# Patient Record
Sex: Male | Born: 1985 | Race: White | Hispanic: No | Marital: Married | Smoking: Current every day smoker
Health system: Southern US, Community
[De-identification: ages and names within clinical notes are randomized; demographics above are authoritative.]

## PROBLEM LIST (undated history)

## (undated) ENCOUNTER — Emergency Department (HOSPITAL_COMMUNITY): Admission: EM | Payer: Self-pay | Source: Home / Self Care

---
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
## 2012-09-28 ENCOUNTER — Encounter (HOSPITAL_COMMUNITY): Payer: Self-pay

## 2012-09-28 ENCOUNTER — Emergency Department (HOSPITAL_COMMUNITY): Payer: Self-pay

## 2012-09-28 ENCOUNTER — Emergency Department (HOSPITAL_COMMUNITY)
Admission: EM | Admit: 2012-09-28 | Discharge: 2012-09-28 | Disposition: A | Discharge status: Home / Self Care | Payer: Self-pay | Attending: Emergency Medicine | Admitting: Emergency Medicine

## 2012-09-28 DIAGNOSIS — S63501A Unspecified sprain of right wrist, initial encounter: Secondary | ICD-10-CM

## 2012-09-28 DIAGNOSIS — S63509A Unspecified sprain of unspecified wrist, initial encounter: Secondary | ICD-10-CM | POA: Insufficient documentation

## 2012-09-28 DIAGNOSIS — IMO0002 Reserved for concepts with insufficient information to code with codable children: Secondary | ICD-10-CM | POA: Insufficient documentation

## 2012-09-28 DIAGNOSIS — Y9389 Activity, other specified: Secondary | ICD-10-CM | POA: Insufficient documentation

## 2012-09-28 DIAGNOSIS — S60511A Abrasion of right hand, initial encounter: Secondary | ICD-10-CM

## 2012-09-28 DIAGNOSIS — F17200 Nicotine dependence, unspecified, uncomplicated: Secondary | ICD-10-CM | POA: Insufficient documentation

## 2012-09-28 DIAGNOSIS — S62319A Displaced fracture of base of unspecified metacarpal bone, initial encounter for closed fracture: Secondary | ICD-10-CM | POA: Insufficient documentation

## 2012-09-28 DIAGNOSIS — Y929 Unspecified place or not applicable: Secondary | ICD-10-CM | POA: Insufficient documentation

## 2012-09-28 DIAGNOSIS — S62306A Unspecified fracture of fifth metacarpal bone, right hand, initial encounter for closed fracture: Secondary | ICD-10-CM

## 2012-09-28 MED ORDER — OXYCODONE-ACETAMINOPHEN 5-325 MG PO TABS: 1.0000 | ORAL_TABLET | ORAL | Status: DC | PRN

## 2012-09-28 MED ORDER — OXYCODONE-ACETAMINOPHEN 5-325 MG PO TABS
1.0000 | ORAL_TABLET | Freq: Once | ORAL | Status: AC
  Administered 2012-09-28: 1 via ORAL
  Filled 2012-09-28: qty 1

## 2012-09-28 MED ORDER — CEPHALEXIN 500 MG PO CAPS: 500.0000 mg | ORAL_CAPSULE | Freq: Four times a day (QID) | ORAL | Status: DC

## 2012-09-28 NOTE — ED Provider Notes (Signed)
History     CSN: [id number]  Arrival date & time 09/28/12  1501   First MD Initiated Contact with Patient 09/28/12 1603      Chief Complaint  Patient presents with  . Hand Pain    (Consider location/radiation/quality/duration/timing/severity/associated sxs/prior treatment) HPI Comments: Patient c/o pain and swelling to his right lateral hand that began 2 days ago after he punched a wooden pole.  Pain is worse with movement and improves somewhat with elevation and ice.  He also noticed a small abrasion to the dorsal hand.  He has been cleaning his hand with soap and water.  He denies redness, drainage or numbness and weakness of his hand or fingers.  He also denies foreign body or proximal tenderness. States his  Td is UTD   Patient is a 27 y.o. male presenting with hand pain. The history is provided by the patient.  Hand Pain This is a new problem. Episode onset: 2 days ago. The problem occurs constantly. The problem has been unchanged. Associated symptoms include arthralgias and joint swelling. Pertinent negatives include no fever, nausea, neck pain, numbness, swollen glands, vomiting or weakness. The symptoms are aggravated by bending (movement and palpation). He has tried ice and position changes for the symptoms. The treatment provided no relief.    History reviewed. No pertinent past medical history.  History reviewed. No pertinent past surgical history.  No family history on file.  History  Substance Use Topics  . Smoking status: Current Every Day Smoker -- 0.50 packs/day  . Smokeless tobacco: Not on file  . Alcohol Use: No     Comment: occasional      Review of Systems  Constitutional: Negative for fever and appetite change.  HENT: Negative for neck pain.   Gastrointestinal: Negative for nausea and vomiting.  Musculoskeletal: Positive for joint swelling and arthralgias.  Skin:       Abrasion dorsal right hand  Neurological: Negative for weakness and numbness.  All  other systems reviewed and are negative.    Allergies  Review of patient's allergies indicates no known allergies.  Home Medications  No current outpatient prescriptions on file.  BP 164/90  Pulse 103  Temp(Src) 97.6 F (36.4 C) (Oral)  Resp 24  Ht 5\' 1"  (1.549 m)  Wt 210 lb (95.255 kg)  BMI 39.7 kg/m2  SpO2 98%  Physical Exam  Nursing note and vitals reviewed. Constitutional: He is oriented to person, place, and time. He appears well-developed and well-nourished. No distress.  HENT:  Head: Normocephalic and atraumatic.  Cardiovascular: Normal rate, regular rhythm and normal heart sounds.   No murmur heard. Pulmonary/Chest: Effort normal and breath sounds normal.  Musculoskeletal: He exhibits edema and tenderness.       Right hand: He exhibits decreased range of motion, tenderness, bony tenderness and swelling. He exhibits normal two-point discrimination, normal capillary refill and no deformity. Normal sensation noted. Normal strength noted. He exhibits no wrist extension trouble.       Hands: Moderate STS and slight bruising to the dorsal aspect of the lateral right hand.  ttp over the fifth MC.  Pea sized abrasion present w/o surrounding erythema, excessive warmth or drainage.  Appears scabbed over.  Radial pulse is brisk, distal sensation intact.  CR< 2 sec.  Patient also has ttp of the lateral wrist w/o edema or bony deformity of the wrist. FROM of the right elbow  Neurological: He is alert and oriented to person, place, and time. He exhibits normal muscle tone.  Coordination normal.  Skin: Skin is warm and dry.    ED Course  Procedures (including critical care time)  Labs Reviewed - No data to display Dg Hand Complete Right  09/28/2012  *RADIOLOGY REPORT*  Clinical Data: Punching injury.  Right lateral hand pain and swelling.  RIGHT HAND - COMPLETE 3+ VIEW  Comparison: None.  Findings: A comminuted fracture of the distal fifth metacarpal is seen with the mild to moderate  volar angulation of the distal fracture fragment.  No other fractures are identified.  No evidence of dislocation. The  IMPRESSION: Comminuted fracture of the distal fifth metacarpal, with mild to moderate volar angulation.   Original Report Authenticated By: Myles Rosenthal, M.D.       Ulnar gutter splint applied, re-evaluated by me.  pain improved, remains NV intact.  Sling also applied by nursing.    MDM   Small abrasion to dorsal right hand that appears to be healing well.  No clinical signs of infection or obvious FB's.  Abrasion was covered with Webril prior to splint application.     4:42 PM Consulted Dr. Romeo Apple.  Will see pt in his office, advised to have pt call tomorrow for appt time.    Prescribed: Keflex Percocet #20  The patient appears reasonably screened and/or stabilized for discharge and I doubt any other medical condition or other [facility] requiring further screening, evaluation, or treatment in the ED at this time prior to discharge.       Kamerin Axford L. Trisha Mangle, PA-C 09/28/12 1649

## 2012-09-28 NOTE — ED Notes (Signed)
Pt c/o right hand pain and swelling since punching a wooden pole Friday am.

## 2012-09-29 ENCOUNTER — Encounter: Payer: Self-pay | Admitting: Orthopedic Surgery

## 2012-09-29 ENCOUNTER — Ambulatory Visit (INDEPENDENT_AMBULATORY_CARE_PROVIDER_SITE_OTHER): Payer: Self-pay | Admitting: Orthopedic Surgery

## 2012-09-29 VITALS — BP 130/68 | Ht 71.0 in | Wt 217.0 lb

## 2012-09-29 DIAGNOSIS — S62309A Unspecified fracture of unspecified metacarpal bone, initial encounter for closed fracture: Secondary | ICD-10-CM | POA: Insufficient documentation

## 2012-09-29 MED ORDER — OXYCODONE-ACETAMINOPHEN 5-325 MG PO TABS: 1.0000 | ORAL_TABLET | ORAL | Status: DC | PRN

## 2012-09-29 NOTE — Patient Instructions (Addendum)
Keep  Cast dry   Do not get wet   If it gets wet dry with a hair dryer on low setting and call the office   OOW today fracture hand

## 2012-09-29 NOTE — Progress Notes (Signed)
  Subjective:    Aaron Li is a 27 y.o. male who presents for evaluation of right hand/finger pain. Onset was sudden, related to hit railing. Mechanism of injury: punching . The pain is severe, worsens with movement, and is relieved by rest. There is no associated numbness, tingling, weakness in hand . Evaluation to date: plain films: Comminuted fracture right hand fifth metacarpal at the neck shaft area. Treatment to date: Splint, Percocet recommendation ice elevation.  The following portions of the patient's history were reviewed and updated as appropriate: allergies, current medications, past family history, past medical history, past social history, past surgical history and problem list.  Review of Systems A comprehensive review of systems was negative except for: Fever, chills, fatigue cough, snoring, heartburn, nausea, vomiting, constipation, diarrhea.    Objective:    BP 130/68  Ht 5\' 11"  (1.803 m)  Wt 217 lb (98.431 kg)  BMI 30.28 kg/m2  General appearance is normal, the patient is alert and oriented x3 with normal mood and affect. Ambulation is normal Right hand:  soft tissue tenderness and swelling at the Fifth metacarpal of the right hand and Tenderness and swelling of the wrist as well. Range of motion is diminished at the metacarpophalangeal joints, joints are stable. Muscle tone is normal skin abrasion is over the fourth metacarpal no surrounding erythema  Left hand:  normal exam, no swelling, tenderness, instability; ligaments intact, full ROM both hands, wrists, and finger joints and Motor exam is normal   Imaging: X-ray right hand: soft tissue swelling and fracture of Fifth metacarpal distal with angulation    Assessment:    Fracture of Fifth metacarpal right hand    Plan:    Recommend splint and followup in 2 weeks to start active range of motion. This patient is a self-pay cannot afford surgery which would running in the neighborhood of about $8000.We  decided to go ahead and treat him with splinting and early range of motion excepting a possible deformity of the knuckle but normal hand function.

## 2012-09-29 NOTE — ED Provider Notes (Signed)
Medical screening examination/treatment/procedure(s) were performed by non-physician practitioner and as supervising physician I was immediately available for consultation/collaboration.    Vida Roller, MD 09/29/12 [phone]

## 2012-10-07 ENCOUNTER — Ambulatory Visit (INDEPENDENT_AMBULATORY_CARE_PROVIDER_SITE_OTHER): Payer: Self-pay | Admitting: Orthopedic Surgery

## 2012-10-07 ENCOUNTER — Encounter: Payer: Self-pay | Admitting: Orthopedic Surgery

## 2012-10-07 VITALS — BP 118/72 | Ht 71.0 in | Wt 217.0 lb

## 2012-10-07 DIAGNOSIS — S62309D Unspecified fracture of unspecified metacarpal bone, subsequent encounter for fracture with routine healing: Secondary | ICD-10-CM

## 2012-10-07 DIAGNOSIS — S5290XD Unspecified fracture of unspecified forearm, subsequent encounter for closed fracture with routine healing: Secondary | ICD-10-CM

## 2012-10-07 NOTE — Progress Notes (Signed)
Patient ID: Aaron Li, male   DOB: 24-Sep-1985, 27 y.o.   MRN: [id number] Chief Complaint  Patient presents with  . Follow-up    follow up right hand fracture DOI 09/26/12   Aaron Li is a 27 y.o. male who presents for evaluation of right hand/finger pain. Onset was sudden, related to hit railing. Mechanism of injury: punching . The pain is severe, worsens with movement, and is relieved by rest. There is no associated numbness, tingling, weakness in hand . Evaluation to date: plain films: Comminuted fracture right hand fifth metacarpal at the neck shaft area. Treatment to date: Splint, Percocet recommendation ice elevation.   The patient reports continued pain in his right hand he took his plaster splint are placed in an aluminum splint with buddy taping as the additional immobilization  Recommend return in one week to remove the splint and start buddy taping range of motion exercises  He is agreeable to avoiding x-ray as an interval study an x-ray and it at the end of treatment when we think it's healed to keep his cost down The patient is a self-pay cannot afford any surgical intervention or fancy splints Continue current pain medication

## 2012-10-14 ENCOUNTER — Ambulatory Visit (INDEPENDENT_AMBULATORY_CARE_PROVIDER_SITE_OTHER): Payer: Self-pay | Admitting: Orthopedic Surgery

## 2012-10-14 ENCOUNTER — Encounter: Payer: Self-pay | Admitting: Orthopedic Surgery

## 2012-10-14 VITALS — BP 120/82 | Ht 71.0 in | Wt 217.0 lb

## 2012-10-14 DIAGNOSIS — S62309A Unspecified fracture of unspecified metacarpal bone, initial encounter for closed fracture: Secondary | ICD-10-CM

## 2012-10-14 MED ORDER — OXYCODONE-ACETAMINOPHEN 5-325 MG PO TABS: 1.0000 | ORAL_TABLET | ORAL | Status: DC | PRN

## 2012-10-14 NOTE — Patient Instructions (Addendum)
Tape fingers together start moving the joint as instructed   Ok to change the tape as needed

## 2012-10-14 NOTE — Progress Notes (Signed)
Patient ID: Aaron Li, male   DOB: December 20, 1985, 27 y.o.   MRN: [id number] Chief Complaint  Patient presents with  . Follow-up    Follow- up right hand fracture DOI 09/26/12   BP 120/82  Ht 5\' 11"  (1.803 m)  Wt 217 lb (98.431 kg)  BMI 30.28 kg/m2  Alignment of the finger is normal   Apply tape   Refill medication start ROM exercises

## 2012-11-06 ENCOUNTER — Ambulatory Visit: Payer: Self-pay | Admitting: Orthopedic Surgery

## 2016-08-14 ENCOUNTER — Encounter (INDEPENDENT_AMBULATORY_CARE_PROVIDER_SITE_OTHER): Payer: Self-pay | Admitting: Internal Medicine

## 2016-08-14 ENCOUNTER — Encounter (INDEPENDENT_AMBULATORY_CARE_PROVIDER_SITE_OTHER): Payer: Self-pay

## 2016-08-27 ENCOUNTER — Encounter (INDEPENDENT_AMBULATORY_CARE_PROVIDER_SITE_OTHER): Payer: Self-pay | Admitting: Internal Medicine

## 2016-08-27 ENCOUNTER — Encounter (INDEPENDENT_AMBULATORY_CARE_PROVIDER_SITE_OTHER): Payer: Self-pay | Admitting: *Deleted

## 2016-08-27 ENCOUNTER — Ambulatory Visit (INDEPENDENT_AMBULATORY_CARE_PROVIDER_SITE_OTHER): Payer: Self-pay | Admitting: Internal Medicine

## 2016-08-27 VITALS — BP 130/85 | HR 100 | Temp 98.1°F | Ht 71.0 in | Wt 238.5 lb

## 2016-08-27 DIAGNOSIS — R6881 Early satiety: Secondary | ICD-10-CM

## 2016-08-27 DIAGNOSIS — R61 Generalized hyperhidrosis: Secondary | ICD-10-CM

## 2016-08-27 DIAGNOSIS — R634 Abnormal weight loss: Secondary | ICD-10-CM

## 2016-08-27 LAB — COMPREHENSIVE METABOLIC PANEL
ALT: 24 U/L (ref 9–46)
AST: 15 U/L (ref 10–40)
Albumin: 4 g/dL (ref 3.6–5.1)
Alkaline Phosphatase: 69 U/L (ref 40–115)
BUN: 13 mg/dL (ref 7–25)
CHLORIDE: 103 mmol/L (ref 98–110)
CO2: 28 mmol/L (ref 20–31)
CREATININE: 0.86 mg/dL (ref 0.60–1.35)
Calcium: 9.8 mg/dL (ref 8.6–10.3)
GLUCOSE: 100 mg/dL — AB (ref 65–99)
Potassium: 5.1 mmol/L (ref 3.5–5.3)
Sodium: 138 mmol/L (ref 135–146)
Total Bilirubin: 0.6 mg/dL (ref 0.2–1.2)
Total Protein: 7.3 g/dL (ref 6.1–8.1)

## 2016-08-27 LAB — CBC WITH DIFFERENTIAL/PLATELET
BASOS ABS: 0 {cells}/uL (ref 0–200)
Basophils Relative: 0 %
EOS ABS: 222 {cells}/uL (ref 15–500)
Eosinophils Relative: 2 %
HEMATOCRIT: 36.3 % — AB (ref 38.5–50.0)
Hemoglobin: 12 g/dL — ABNORMAL LOW (ref 13.2–17.1)
LYMPHS PCT: 16 %
Lymphs Abs: 1776 cells/uL (ref 850–3900)
MCH: 30.2 pg (ref 27.0–33.0)
MCHC: 33.1 g/dL (ref 32.0–36.0)
MCV: 91.4 fL (ref 80.0–100.0)
MONO ABS: 888 {cells}/uL (ref 200–950)
MPV: 9.6 fL (ref 7.5–12.5)
Monocytes Relative: 8 %
NEUTROS PCT: 74 %
Neutro Abs: 8214 cells/uL — ABNORMAL HIGH (ref 1500–7800)
Platelets: 396 10*3/uL (ref 140–400)
RBC: 3.97 MIL/uL — ABNORMAL LOW (ref 4.20–5.80)
RDW: 13.1 % (ref 11.0–15.0)
WBC: 11.1 10*3/uL — ABNORMAL HIGH (ref 3.8–10.8)

## 2016-08-27 LAB — SEDIMENTATION RATE: Sed Rate: 14 mm/hr (ref 0–15)

## 2016-08-27 NOTE — Progress Notes (Addendum)
   Subjective:    Patient ID: Aaron SchaumannSebastian C Li, male    DOB: Sep 01, 1985, 31 y.o.   MRN: [id number]  HPI Referred by Dr.Guarino for weight loss, loss of appetite, cold sweats, fatigue. Cough.  Saw Dr Wende CreaseGuarino 08/10/2016. No fever associated with his symptoms. He tells me he has night sweats. He is not sleeping well.  Has a dry cough. He says he has lost about 15 pounds since the end of December.  He is not having any pain. Marland Kitchen. He has an appetite. He has early satiety.  He has nausea when he has night sweats. He did have some chest early in January and felt like a long needle going thru his chest into his back. Chest pain off and on. All of his symptoms started after returning from a family trip to [place].  No acid reflux. He has a BM daily. No melena or BRRB. States he feels 50-60% better. 1/262018  H and H 12.8 and 37.9, platelet ct 549 08/10/2016 Chest xray: negative. No trauma.  Review of Systems No past medical history on file.  No past surgical history on file.  No Known Allergies  No current outpatient prescriptions on file prior to visit.   No current facility-administered medications on file prior to visit.    No current outpatient prescriptions on file.   No current facility-administered medications for this visit.        Objective:   Physical Exam Blood pressure 130/85, pulse 100, temperature 98.1 F (36.7 C), height 5\' 11"  (1.803 m), weight 238 lb 8 oz (108.2 kg).  Alert and oriented. Skin warm and dry. Oral mucosa is moist.   . Sclera anicteric, conjunctivae is pink. Thyroid not enlarged. No cervical lymphadenopathy. Lungs clear. Heart regular rate and rhythm.  Abdomen is soft. Bowel sounds are positive. No hepatomegaly. No abdominal masses felt. No tenderness.  No edema to lower extremities.  .       Assessment & Plan:  Early satiety, weight loss, night sweats.  Am going to get a CT abdomen/pelvis with CM.  CBC, Hepatic function. sedrate

## 2016-08-27 NOTE — Patient Instructions (Signed)
Labs and CT abdomen/pelvis with CM.  

## 2016-08-31 ENCOUNTER — Encounter (INDEPENDENT_AMBULATORY_CARE_PROVIDER_SITE_OTHER): Payer: Self-pay

## 2016-09-03 ENCOUNTER — Ambulatory Visit (HOSPITAL_COMMUNITY)
Admission: RE | Admit: 2016-09-03 | Discharge: 2016-09-03 | Disposition: A | Discharge status: Home / Self Care | Payer: Self-pay | Source: Ambulatory Visit | Attending: Internal Medicine | Admitting: Internal Medicine

## 2016-09-03 DIAGNOSIS — K7689 Other specified diseases of liver: Secondary | ICD-10-CM | POA: Insufficient documentation

## 2016-09-03 DIAGNOSIS — R61 Generalized hyperhidrosis: Secondary | ICD-10-CM

## 2016-09-03 DIAGNOSIS — R6881 Early satiety: Secondary | ICD-10-CM

## 2016-09-03 DIAGNOSIS — R634 Abnormal weight loss: Secondary | ICD-10-CM

## 2016-09-03 DIAGNOSIS — I313 Pericardial effusion (noninflammatory): Secondary | ICD-10-CM | POA: Insufficient documentation

## 2016-09-03 MED ORDER — IOPAMIDOL (ISOVUE-300) INJECTION 61%
100.0000 mL | Freq: Once | INTRAVENOUS | Status: AC | PRN
  Administered 2016-09-03: 100 mL via INTRAVENOUS

## 2016-09-04 ENCOUNTER — Other Ambulatory Visit (INDEPENDENT_AMBULATORY_CARE_PROVIDER_SITE_OTHER): Payer: Self-pay | Admitting: Internal Medicine

## 2016-09-04 ENCOUNTER — Telehealth (INDEPENDENT_AMBULATORY_CARE_PROVIDER_SITE_OTHER): Payer: Self-pay | Admitting: Internal Medicine

## 2016-09-04 DIAGNOSIS — I3139 Other pericardial effusion (noninflammatory): Secondary | ICD-10-CM

## 2016-09-04 DIAGNOSIS — I313 Pericardial effusion (noninflammatory): Secondary | ICD-10-CM

## 2016-09-04 NOTE — Telephone Encounter (Signed)
error 

## 2016-09-04 NOTE — Telephone Encounter (Signed)
Cardiology referral  

## 2016-09-04 NOTE — Telephone Encounter (Signed)
Spoke to cardiology office, they will contact patient with appt, patient aware

## 2016-10-05 ENCOUNTER — Ambulatory Visit (INDEPENDENT_AMBULATORY_CARE_PROVIDER_SITE_OTHER): Payer: Self-pay | Admitting: Cardiovascular Disease

## 2016-10-05 ENCOUNTER — Encounter: Payer: Self-pay | Admitting: Cardiovascular Disease

## 2016-10-05 VITALS — BP 140/90 | HR 104 | Ht 71.0 in | Wt 243.0 lb

## 2016-10-05 DIAGNOSIS — I313 Pericardial effusion (noninflammatory): Secondary | ICD-10-CM

## 2016-10-05 DIAGNOSIS — I3139 Other pericardial effusion (noninflammatory): Secondary | ICD-10-CM

## 2016-10-05 DIAGNOSIS — Z716 Tobacco abuse counseling: Secondary | ICD-10-CM

## 2016-10-05 DIAGNOSIS — R079 Chest pain, unspecified: Secondary | ICD-10-CM

## 2016-10-05 NOTE — Progress Notes (Signed)
CARDIOLOGY CONSULT NOTE  Patient ID: Aaron Li MRN: [id number] DOB/AGE: 1985-11-23 31 y.o.  Admit date: (Not on file) Primary Physician: No PCP Per Patient Referring Physician: Dorene Ar NP  Reason for Consultation: chest pain, pericardial effusion  HPI: The patient is a 31 year old male who has been referred by gastroenterology for the evaluation of chest pain and a pericardial effusion. He saw them in February for evaluation of weight loss and diminished appetite. A CT of the abdomen and pelvis showed a small pericardial effusion.  He started developing symptoms in December 2017. There occur about once or twice a week. He describes a retrosternal chest pain which is sometimes dull and sometimes sharp, and aggravated with deep inspiration. He believes his GI symptoms were due to a virus have completely resolved. He denies abdominal pain, nausea, vomiting, diarrhea, and constipation. He did have some fevers in 07/2016 but these have resolved. He has had increasing exertional dyspnea for the past 2 months and decreased stamina.  He smokes one pack of cigarettes daily or less and has done so for 15 years.  ECG performed in the office today showed sinus tachycardia with a diffuse nonspecific T wave abnormality, 102 bpm.     No Known Allergies  No current outpatient prescriptions on file.   No current facility-administered medications for this visit.     History reviewed. No pertinent past medical history.  History reviewed. No pertinent surgical history.  Social History   Social History  . Marital status: Married    Spouse name: N/A  . Number of children: N/A  . Years of education: N/A   Occupational History  . Not on file.   Social History Main Topics  . Smoking status: Current Every Day Smoker    Packs/day: 1.00  . Smokeless tobacco: Never Used  . Alcohol use No     Comment: ery little.   . Drug use: No  . Sexual activity: Not on file   Other  Topics Concern  . Not on file   Social History Narrative  . No narrative on file     No family history of premature CAD in 1st degree relatives.  Prior to Admission medications   Not on File     Review of systems complete and found to be negative unless listed above in HPI     Physical exam Blood pressure 140/90, pulse (!) 104, height 5\' 11"  (1.803 m), weight 243 lb (110.2 kg), SpO2 97 %. General: NAD Neck: No JVD, no thyromegaly or thyroid nodule.  Lungs: Clear to auscultation bilaterally with normal respiratory effort. CV: Nondisplaced PMI. Regular rate and rhythm, normal S1/S2, no S3/S4, no murmur.  No peripheral edema.  No carotid bruit.  Normal pedal pulses.  Abdomen: Soft, nontender, no hepatosplenomegaly, no distention.  Skin: Intact without lesions or rashes.  Neurologic: Alert and oriented x 3.  Psych: Normal affect. Extremities: No clubbing or cyanosis.  HEENT: Normal.   ECG: Most recent ECG reviewed.  Telemetry: Independently reviewed.  Labs:   Lab Results  Component Value Date   WBC 11.1 (H) 08/27/2016   HGB 12.0 (L) 08/27/2016   HCT 36.3 (L) 08/27/2016   MCV 91.4 08/27/2016   PLT 396 08/27/2016   No results for input(s): NA, K, CL, CO2, BUN, CREATININE, CALCIUM, PROT, BILITOT, ALKPHOS, ALT, AST, GLUCOSE in the last 168 hours.  Invalid input(s): LABALBU No results found for: CKTOTAL, CKMB, CKMBINDEX, TROPONINI No results found for: CHOL No  results found for: HDL No results found for: LDLCALC No results found for: TRIG No results found for: CHOLHDL No results found for: LDLDIRECT       Studies: No results found.  ASSESSMENT AND PLAN:  1. Chest pain: Atypical for an ischemic etiology. He denies exertional chest pain and it appears to be more pleuritic in quality. I do not appreciate a pericardial rub. I will obtain an echocardiogram to evaluate cardiac structure and function.  2. Pericardial effusion: I will order a 2-D echocardiogram with  Doppler to evaluate cardiac structure, function, and regional wall motion.  3. Tobacco abuse: Cessation counseling provided (1 minute).  Dispo: fu to be determined.   Signed: Prentice DockerSuresh Fawnda Vitullo, M.D., F.A.C.C.  10/05/2016, 8:44 AM

## 2016-10-05 NOTE — Patient Instructions (Signed)
Your physician recommends that you schedule a follow-up appointment in: to be determined after echo    Your physician has requested that you have an echocardiogram. Echocardiography is a painless test that uses sound waves to create images of your heart. It provides your doctor with information about the size and shape of your heart and how well your heart's chambers and valves are working. This procedure takes approximately one hour. There are no restrictions for this procedure.         Thank you for choosing [facility] !         

## 2016-10-08 ENCOUNTER — Ambulatory Visit (HOSPITAL_COMMUNITY)
Admission: RE | Admit: 2016-10-08 | Discharge: 2016-10-08 | Disposition: A | Discharge status: Home / Self Care | Payer: Self-pay | Source: Ambulatory Visit | Attending: Cardiovascular Disease | Admitting: Cardiovascular Disease

## 2016-10-08 DIAGNOSIS — I313 Pericardial effusion (noninflammatory): Secondary | ICD-10-CM

## 2016-10-08 DIAGNOSIS — Z720 Tobacco use: Secondary | ICD-10-CM | POA: Insufficient documentation

## 2016-10-08 DIAGNOSIS — I3139 Other pericardial effusion (noninflammatory): Secondary | ICD-10-CM

## 2016-10-08 NOTE — Progress Notes (Signed)
*  PRELIMINARY RESULTS* Echocardiogram 2D Echocardiogram has been performed.  Jeryl Columbialliott, Gisella Alwine 10/08/2016, 2:31 PM

## 2017-12-17 IMAGING — CT CT ABD-PELV W/ CM
2 of 3 series · 17 of 46 positions shown, 19 images · IV contrast (iopamidol)
Comparison: None

CLINICAL DATA: Early satiety, night sweats, 15 pound weight loss,
nausea, fatigue, cough, smoker

EXAM:
CT ABDOMEN AND PELVIS WITH CONTRAST
TECHNIQUE: Multidetector CT imaging of the abdomen and pelvis was performed
using the standard protocol following bolus administration of
intravenous contrast. Sagittal and coronal MPR images reconstructed
from axial data set.
CONTRAST:  100mL [id number] IOPAMIDOL ([id number]) INJECTION 61% IV.
Dilute oral contrast.

[Series 2: axial st · axial · 0.88mm/px · z∈[-538,-58]mm · 14 of 112 slices shown, 16 images]
[im 8/112  soft-tissue]
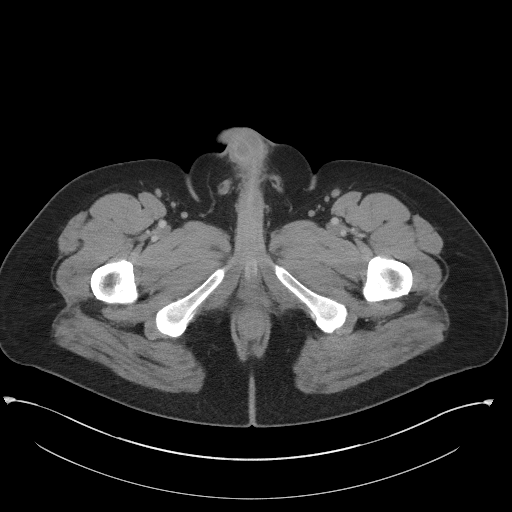
[im 8/112  bone]
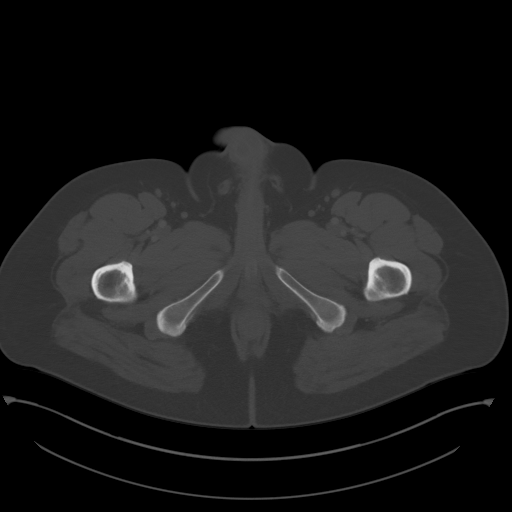
[im 15/112  soft-tissue]
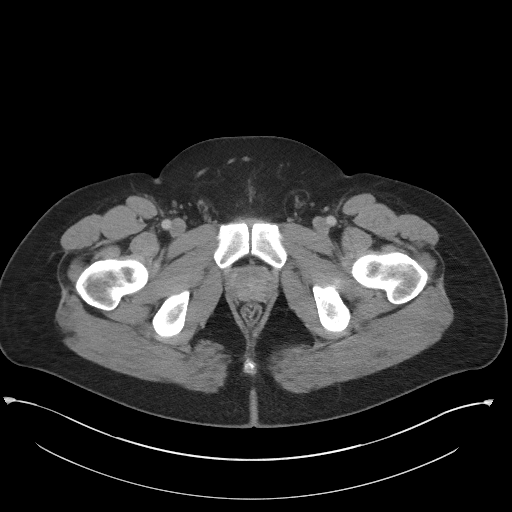
[im 22/112  soft-tissue]
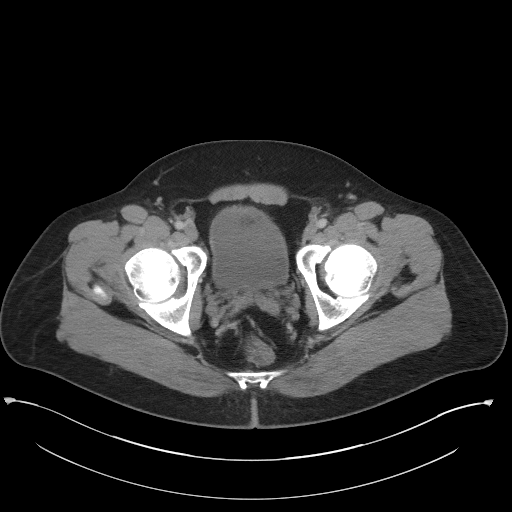
[im 29/112  soft-tissue]
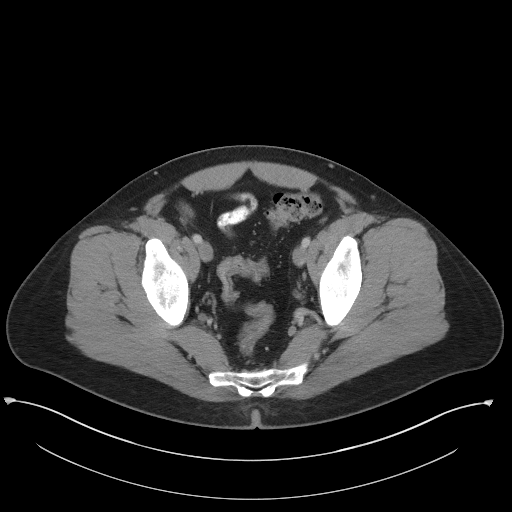
[im 36/112  soft-tissue]
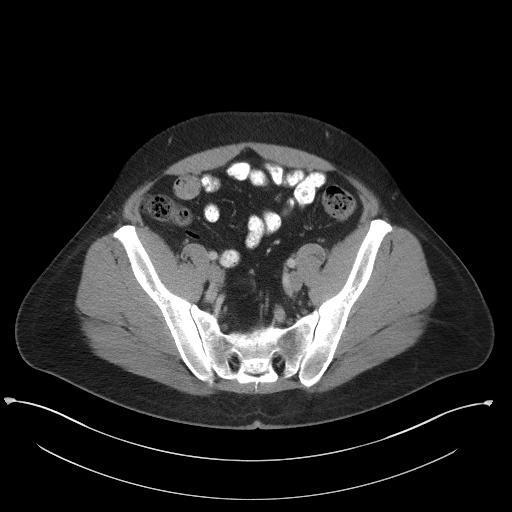
[im 43/112  soft-tissue]
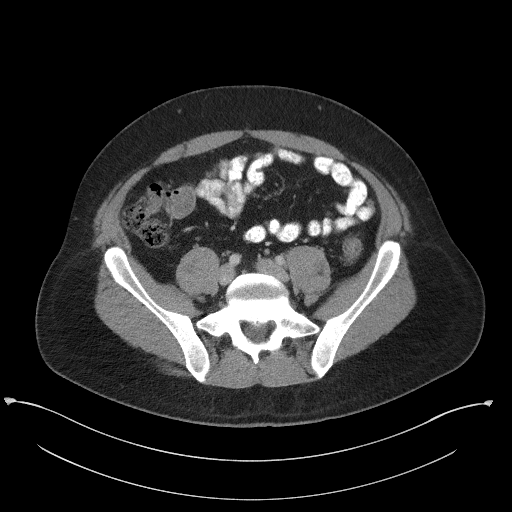
[im 51/112  soft-tissue]
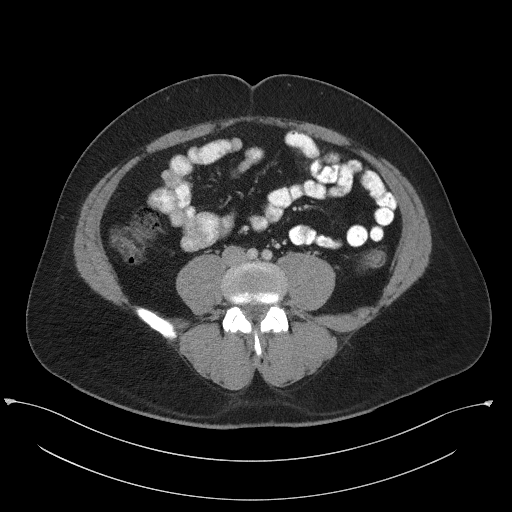
[im 61/112  soft-tissue]
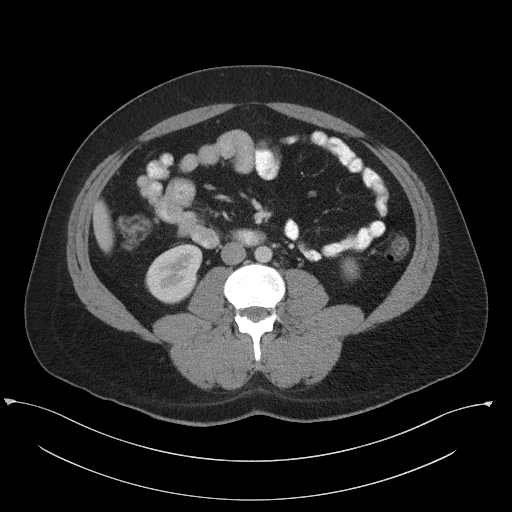
[im 69/112  soft-tissue]
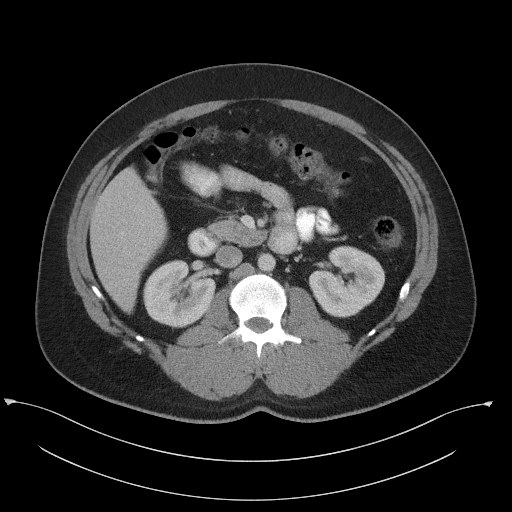
[im 69/112  bone]
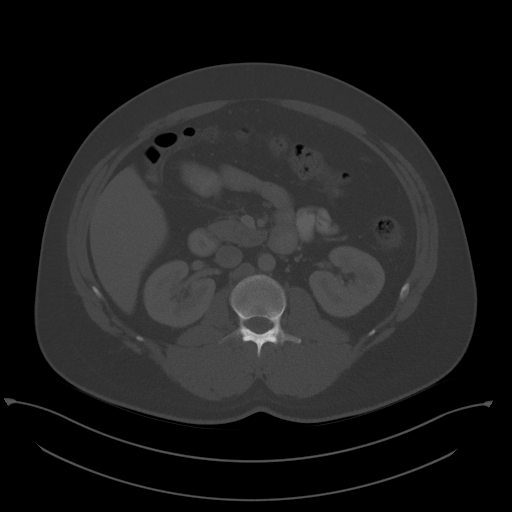
[im 76/112  soft-tissue]
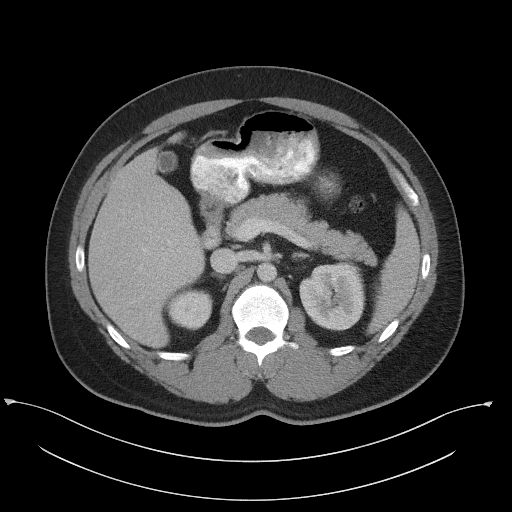
[im 83/112  soft-tissue]
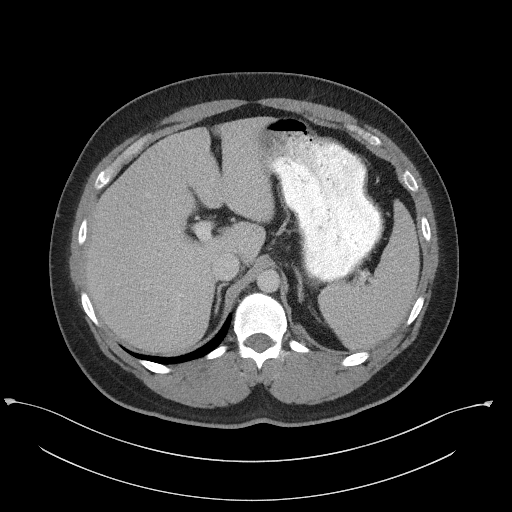
[im 90/112  soft-tissue]
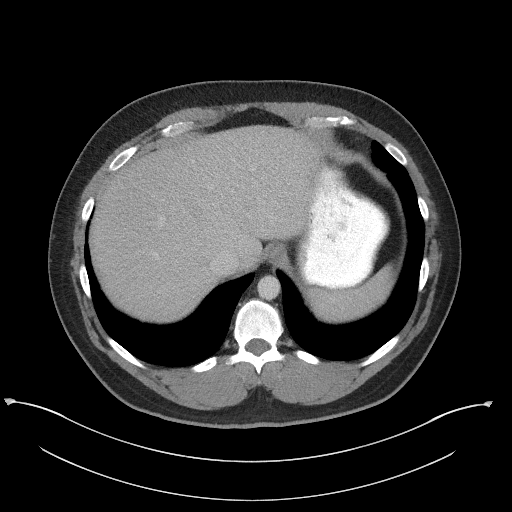
[im 97/112  soft-tissue]
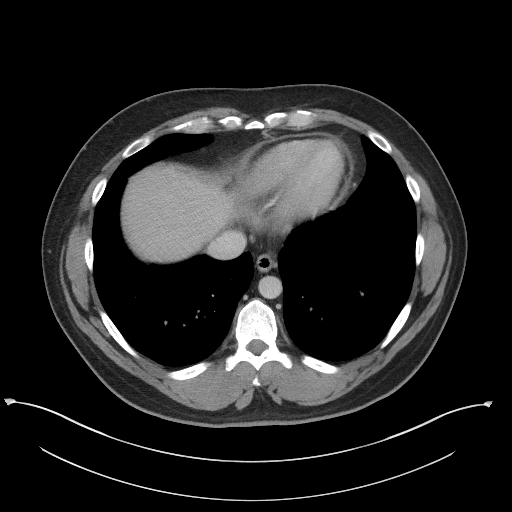
[im 104/112  soft-tissue]
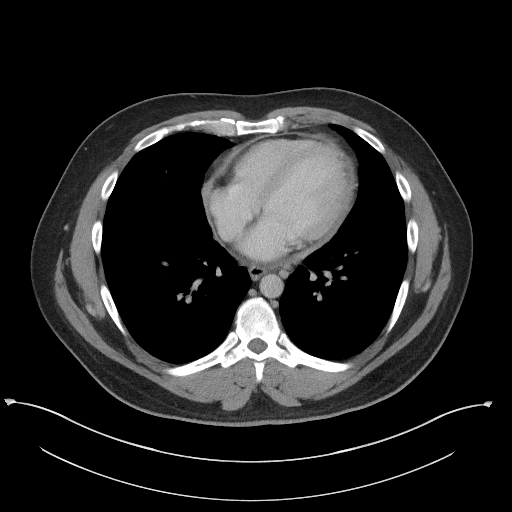

[Series 5: coronal st · coronal · 0.98mm/px · 3 of 116 slices shown]
[im 39/116  soft-tissue]
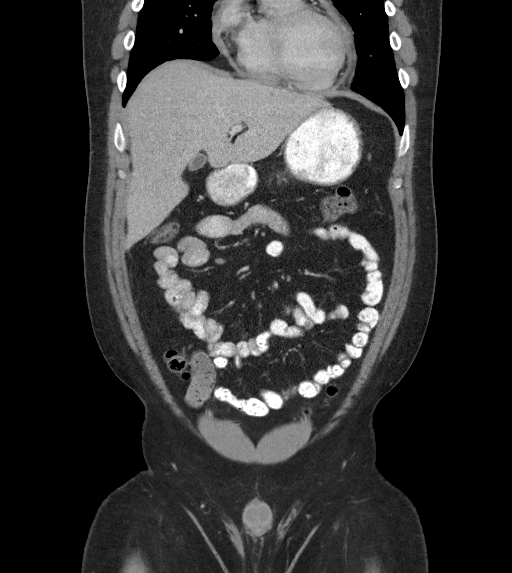
[im 52/116  soft-tissue]
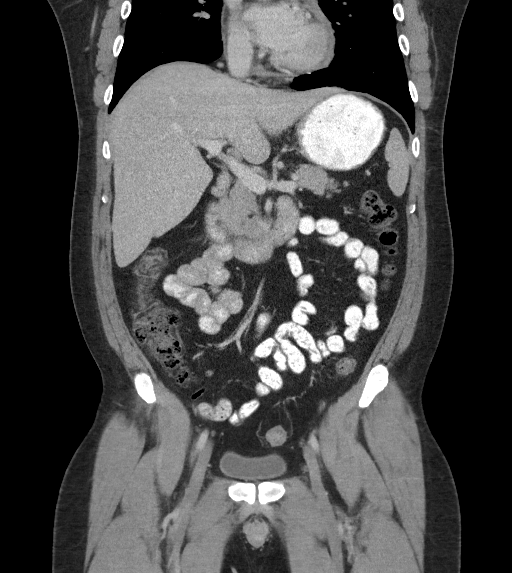
[im 64/116  soft-tissue]
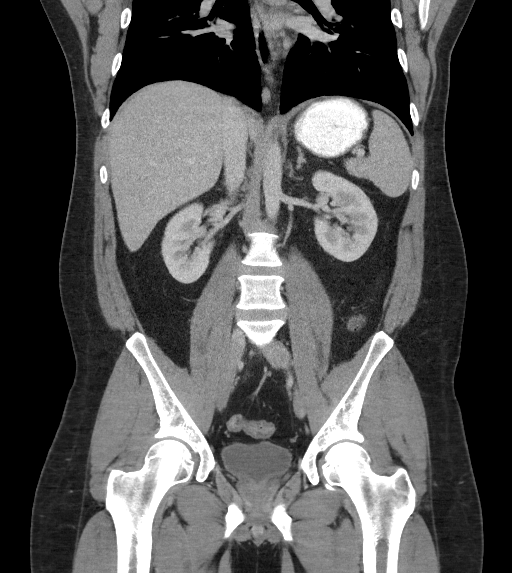

[17 of 46 positions shown; findings below may reference images not displayed]

FINDINGS: Lower chest: Lung bases clear.  Small pericardial effusion present.

Hepatobiliary: Small hepatic cysts largest 7 mm posterior RIGHT lobe
image 27. Gallbladder and liver otherwise normal appearance

Pancreas: Normal appearance

Spleen: Normal appearance

Adrenals/Urinary Tract: Adrenal glands, kidneys, ureters, and
bladder normal appearance

Stomach/Bowel: Normal appendix. Colon un opacified, grossly
unremarkable. Stomach and small bowel loops normal appearance.

Vascular/Lymphatic: Vascular structures unremarkable. No adenopathy.

Reproductive: Unremarkable prostate gland and seminal vesicles

Other: No free air or free fluid. No hernia or acute inflammatory
process.

Musculoskeletal: Normal appearance
IMPRESSION: Small pericardial effusion of uncertain etiology.

Tiny hepatic cysts.

No acute intra-abdominal or intrapelvic abnormalities.
